# Patient Record
Sex: Male | Born: 1985 | Race: Black or African American | Hispanic: No | Marital: Married
Health system: Southern US, Community
[De-identification: ages and names within clinical notes are randomized; demographics above are authoritative.]

---

## 2021-02-20 ENCOUNTER — Encounter (HOSPITAL_BASED_OUTPATIENT_CLINIC_OR_DEPARTMENT_OTHER): Payer: Self-pay | Admitting: Family Medicine

## 2021-02-20 ENCOUNTER — Other Ambulatory Visit (HOSPITAL_BASED_OUTPATIENT_CLINIC_OR_DEPARTMENT_OTHER): Payer: Self-pay | Admitting: Family Medicine

## 2021-02-20 DIAGNOSIS — R52 Pain, unspecified: Secondary | ICD-10-CM

## 2021-02-28 ENCOUNTER — Ambulatory Visit (HOSPITAL_BASED_OUTPATIENT_CLINIC_OR_DEPARTMENT_OTHER)
Admission: RE | Admit: 2021-02-28 | Discharge: 2021-02-28 | Disposition: A | Discharge status: Home / Self Care | Payer: No Typology Code available for payment source | Source: Ambulatory Visit | Attending: Family Medicine | Admitting: Family Medicine

## 2021-02-28 ENCOUNTER — Other Ambulatory Visit: Payer: Self-pay

## 2021-02-28 DIAGNOSIS — R52 Pain, unspecified: Secondary | ICD-10-CM | POA: Insufficient documentation

## 2021-02-28 IMAGING — MR MR KNEE*R* W/O CM
5 of 7 series · 25 of 40 positions shown · non-contrast
Comparison: None.

CLINICAL DATA: Right knee pain

EXAM:
MRI OF THE RIGHT KNEE WITHOUT CONTRAST
TECHNIQUE: Multiplanar, multisequence MR imaging of the knee was performed. No
intravenous contrast was administered.

[Series 3: T2 fat-sat · axial · 3.0mm · 0.31mm/px · z∈[-83,+50]mm · 9 of 38 slices shown (1 of 2)]
[im 1/38]
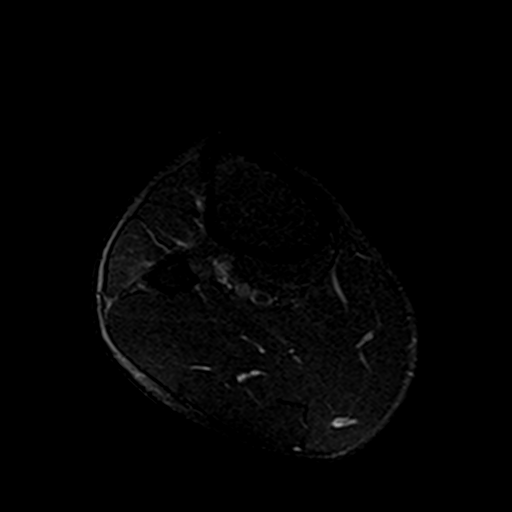
[im 5/38]
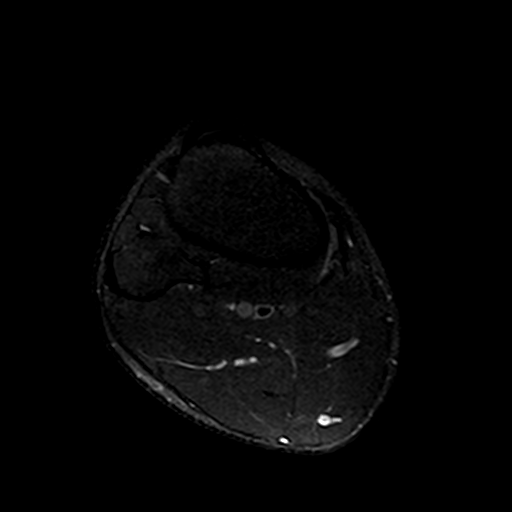
[im 10/38]
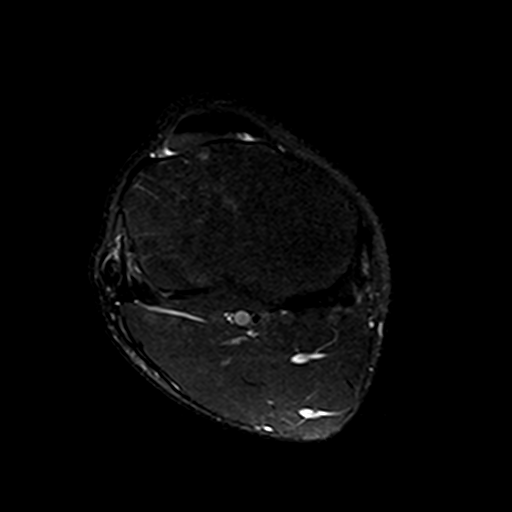
[im 14/38]
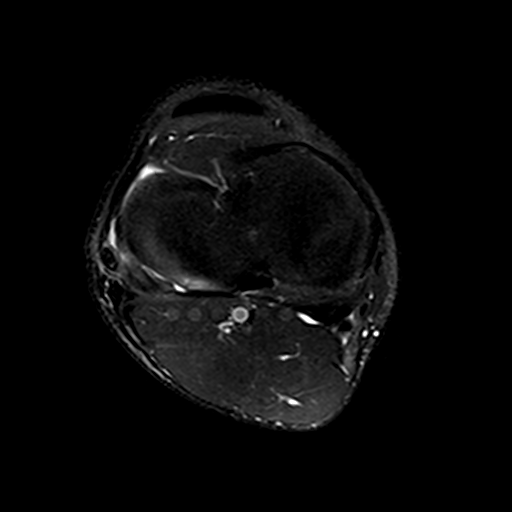
[im 19/38]
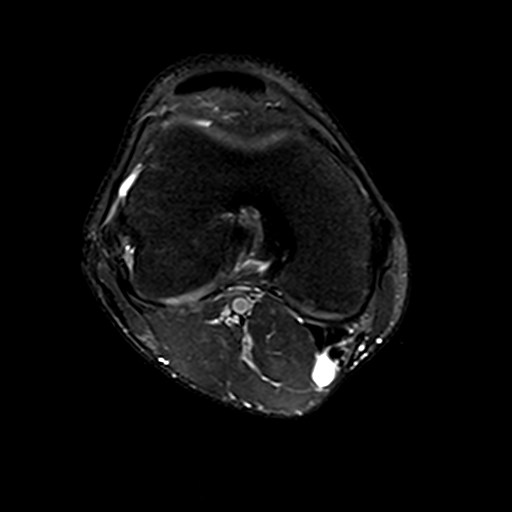
[im 24/38]
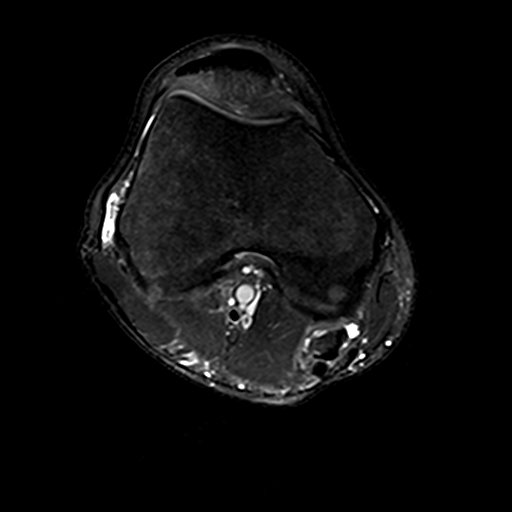
[im 28/38]
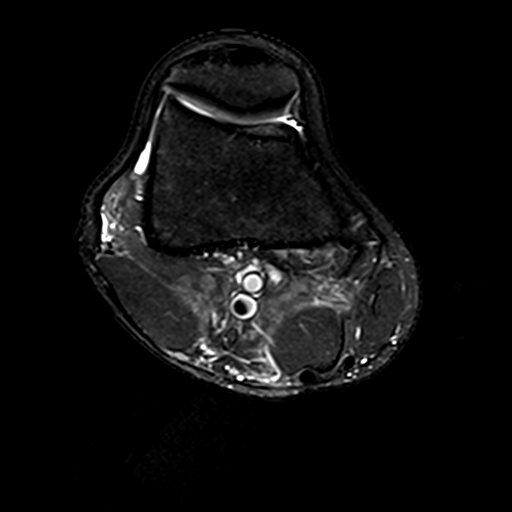
[im 33/38]
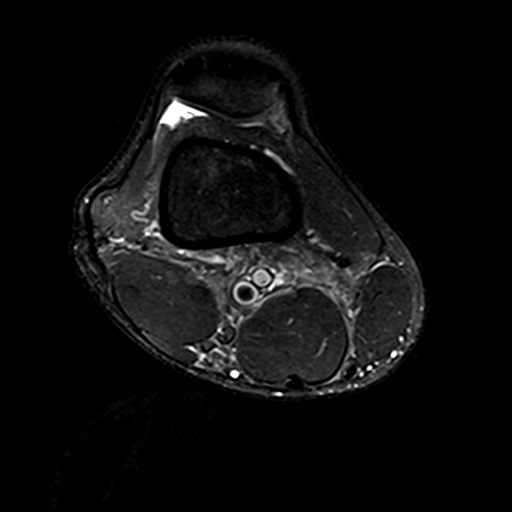
[im 38/38]
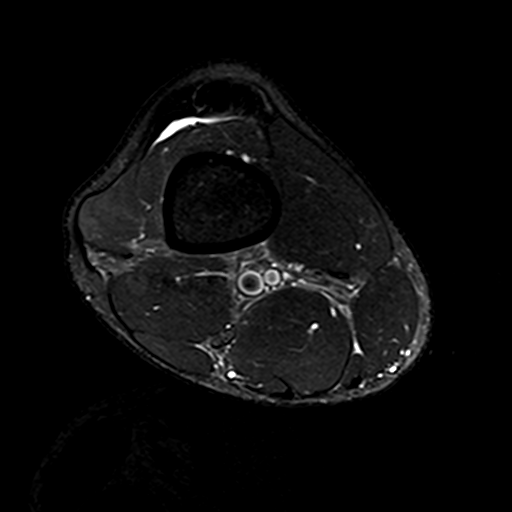

[Series 5: T2 fat-sat · coronal · 4.0mm · 0.29mm/px · 1 of 26 slices shown (2 of 2)]
[im 1/26]
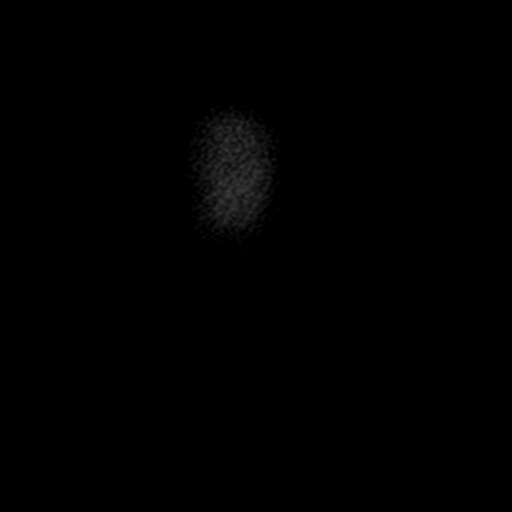

[Series 6: PD fat-sat · coronal · 4.0mm · 0.29mm/px · 5 of 26 slices shown (1 of 2)]
[im 1/26]
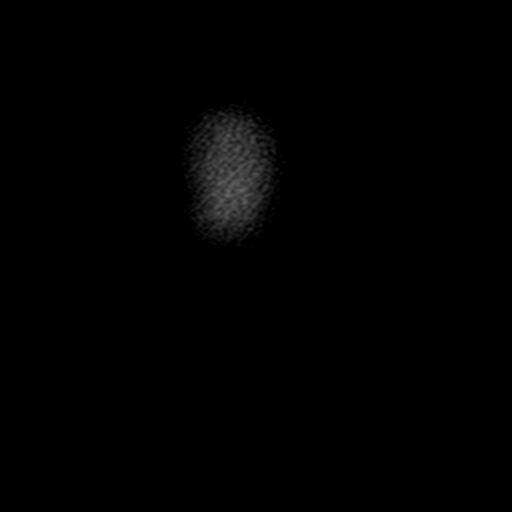
[im 7/26]
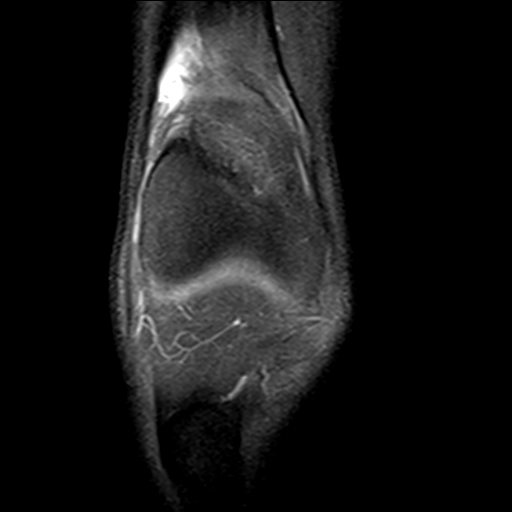
[im 13/26]
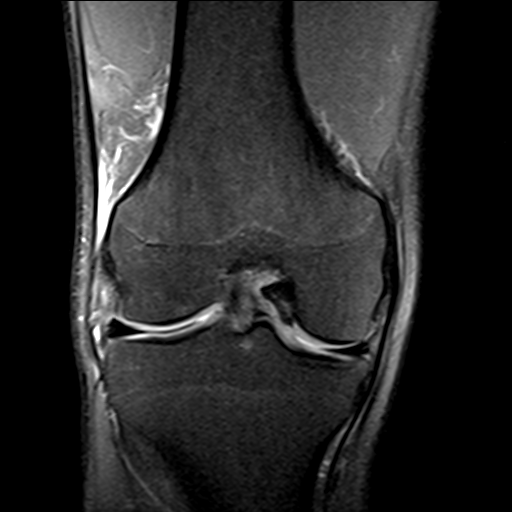
[im 19/26]
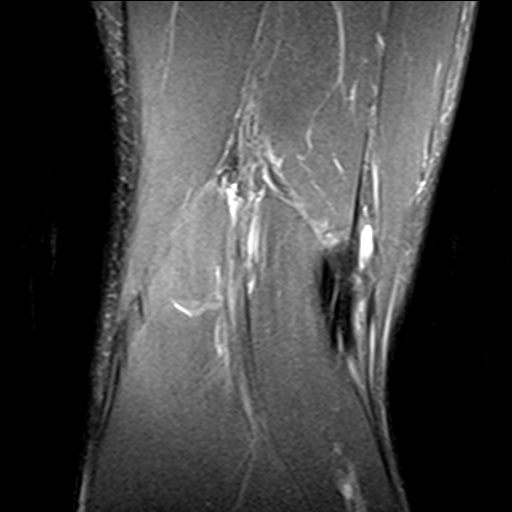
[im 26/26]
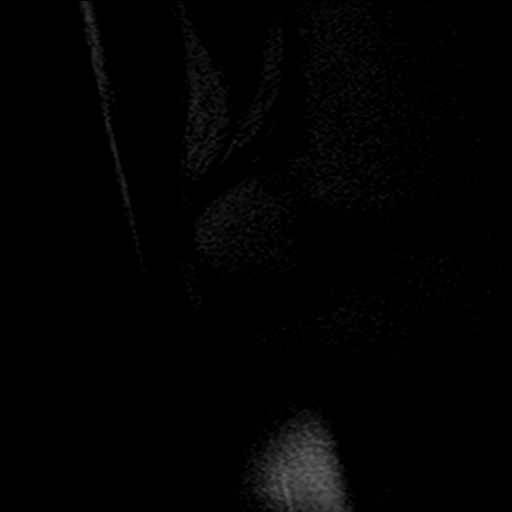

[Series 7: PD fat-sat · sagittal · 3.0mm · 0.59mm/px · 6 of 30 slices shown (2 of 2)]
[im 1/30]
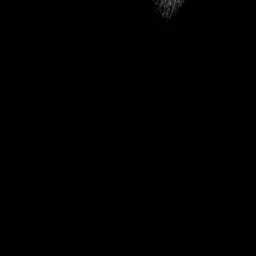
[im 6/30]
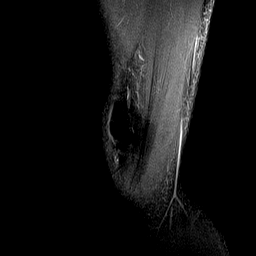
[im 12/30]
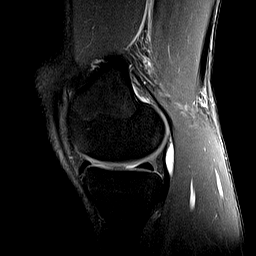
[im 18/30]
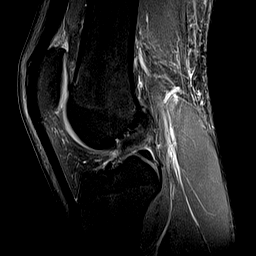
[im 24/30]
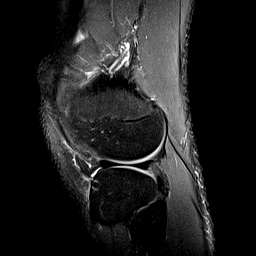
[im 30/30]
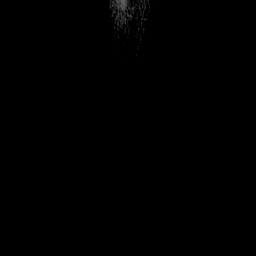

[Series 9: PD · oblique · 2.0mm · 0.47mm/px · 4 of 18 slices shown]
[im 1/18]
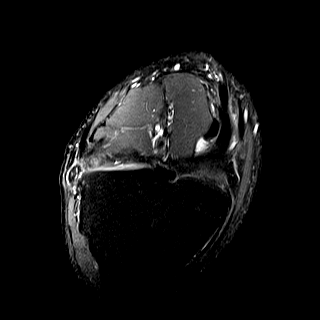
[im 6/18]
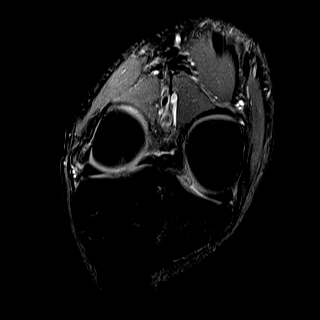
[im 12/18]
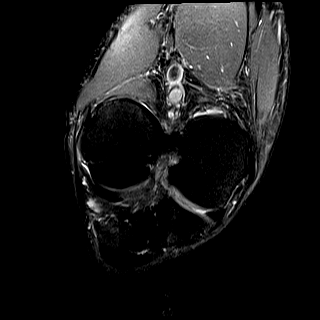
[im 18/18]
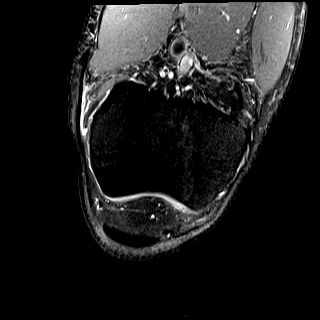

[25 of 40 positions shown; findings below may reference images not displayed]

FINDINGS: MENISCI

Medial: Intact.

Lateral: Intact.

LIGAMENTS

Cruciates: ACL and PCL are intact.

Collaterals: Medial collateral ligament is intact. Thickening and
increased signal within the proximal lateral collateral ligament.
There is intermediate signal within the proximal popliteus tendon.

CARTILAGE

Patellofemoral:  No chondral defect.

Medial:  No chondral defect.

Lateral: Partial-thickness cartilage defect along the weight-bearing
lateral femoral condyle measuring 5 mm (coronal T2 image 15).

JOINT: No significant joint effusion. There is edema signal within
the suprapatellar fat pad.

POPLITEAL FOSSA: Small Baker cyst.

EXTENSOR MECHANISM: Intact quadriceps tendon. Intact patellar
tendon.

BONES: No aggressive osseous lesion. No fracture or dislocation.

Other: No additional findings.
IMPRESSION: Thickening and mildly increased signal within the proximal lateral
collateral ligament compatible grade [DATE] ligamentous sprain.

Tendinosis of the proximal popliteus tendon.

Edema signal within the suprapatellar fat pad which can be seen in
suprapatellar impingement.

5 mm partial-thickness cartilage defect along the weight-bearing
lateral femoral condyle.

No evidence of meniscus tear.  Intact cruciate ligaments.

Small Baker cyst.

## 2021-02-28 IMAGING — MR MR CERVICAL SPINE W/O CM
4 of 5 series · 29 of 48 positions shown · non-contrast
Comparison: None available.

CLINICAL DATA: Initial evaluation for chronic neck pain status post
injury in [employer]. Bilateral proximal arm pain with numbness and
elbow and forearms.

EXAM:
MRI CERVICAL SPINE WITHOUT CONTRAST
TECHNIQUE: Multiplanar, multisequence MR imaging of the cervical spine was
performed. No intravenous contrast was administered.

[Series 2: (id) tse sag · sagittal · 3.0mm · 0.41mm/px · 5 of 17 slices shown]
[im 1/17]
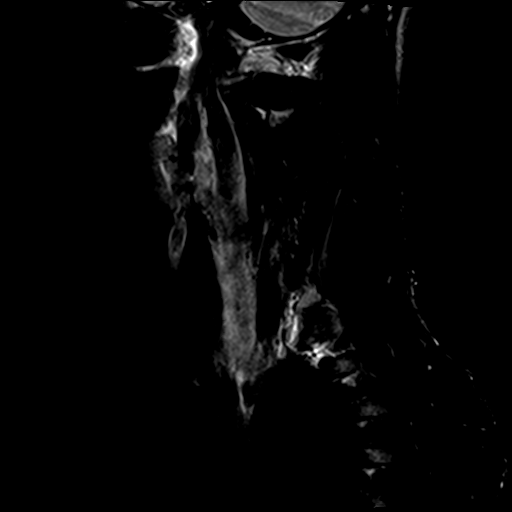
[im 4/17]
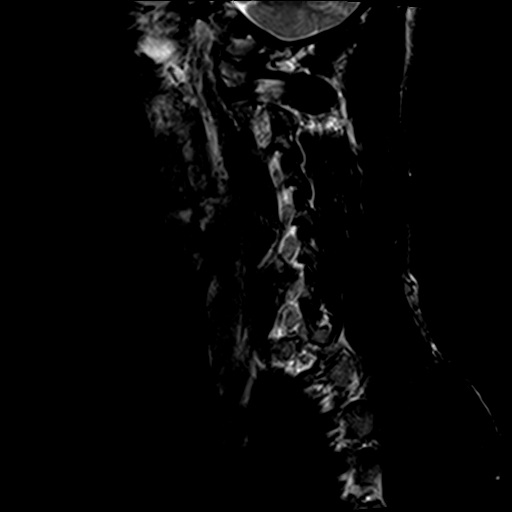
[im 7/17]
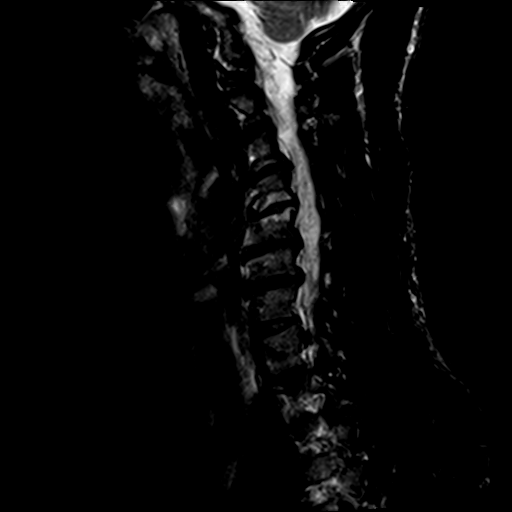
[im 10/17]
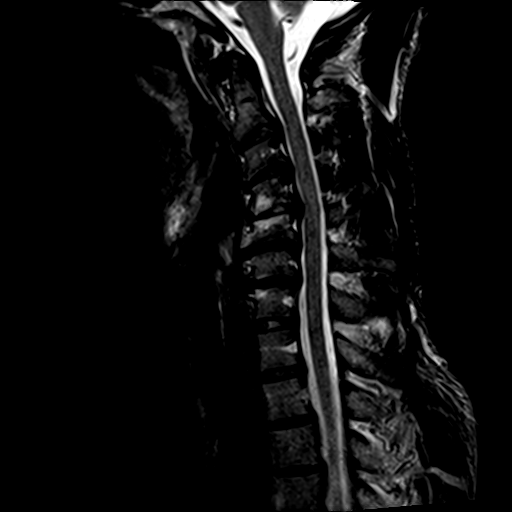
[im 17/17]
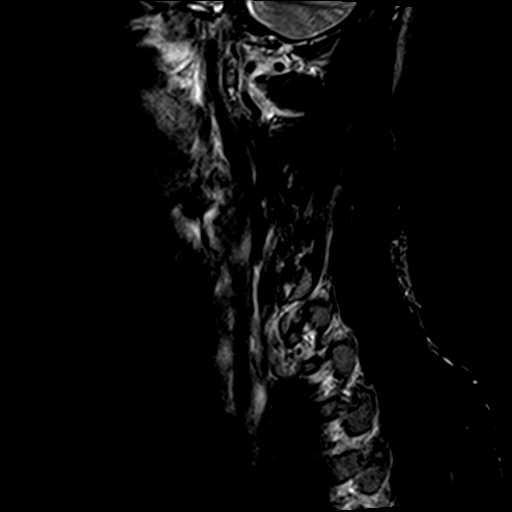

[Series 4: STIR · sagittal · 3.0mm · 0.82mm/px · 6 of 17 slices shown]
[im 1/17]
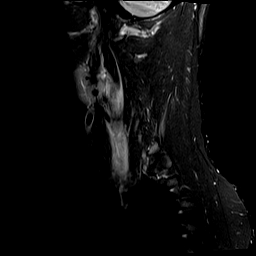
[im 4/17]
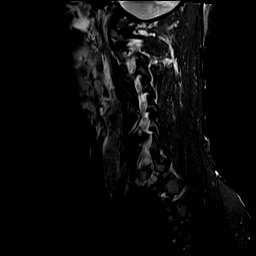
[im 7/17]
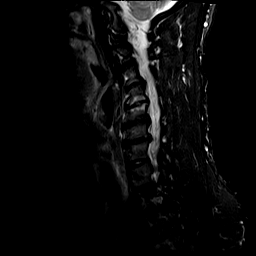
[im 10/17]
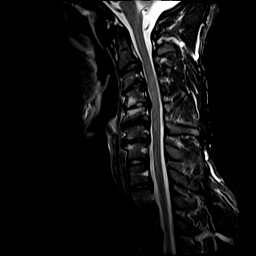
[im 13/17]
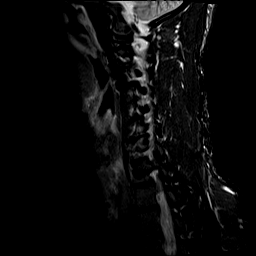
[im 17/17]
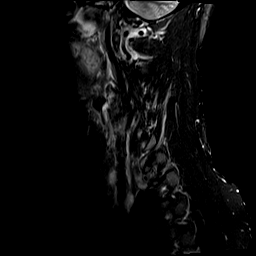

[Series 5: T2 · axial · 3.0mm · 0.39mm/px · z∈[-109,+29]mm · 9 of 40 slices shown (1 of 2)]
[im 1/40]
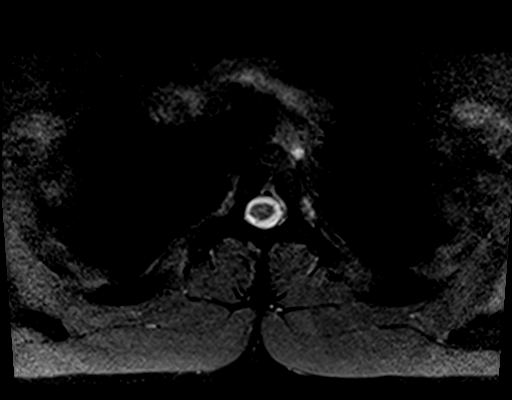
[im 6/40]
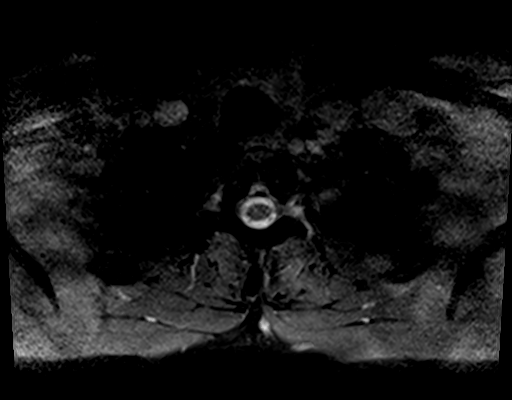
[im 12/40]
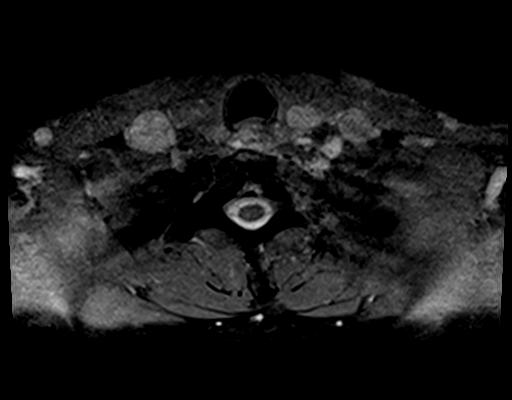
[im 17/40]
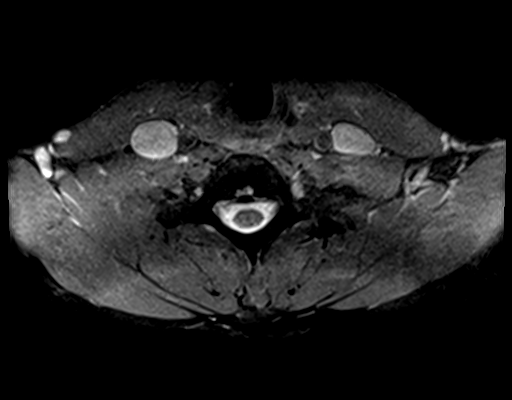
[im 20/40]
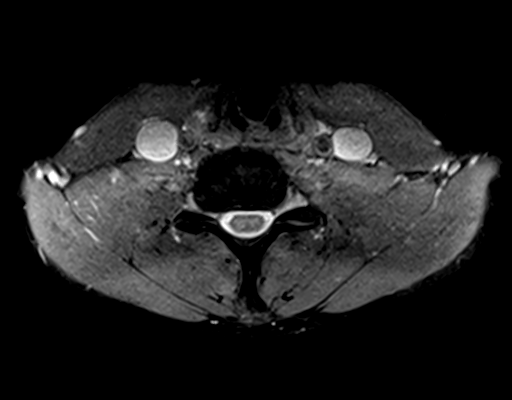
[im 23/40]
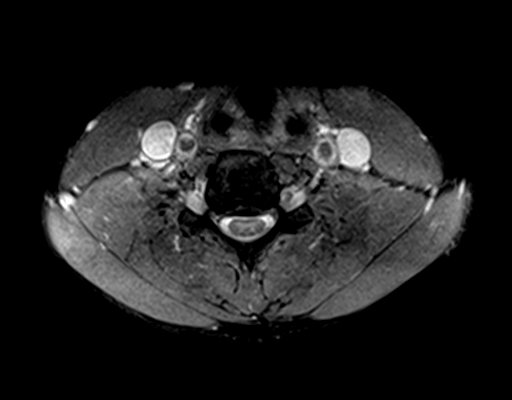
[im 28/40]
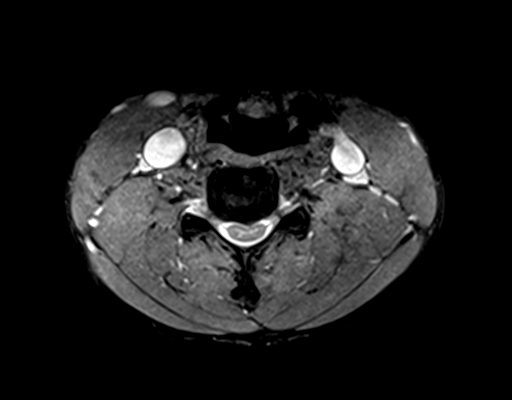
[im 34/40]
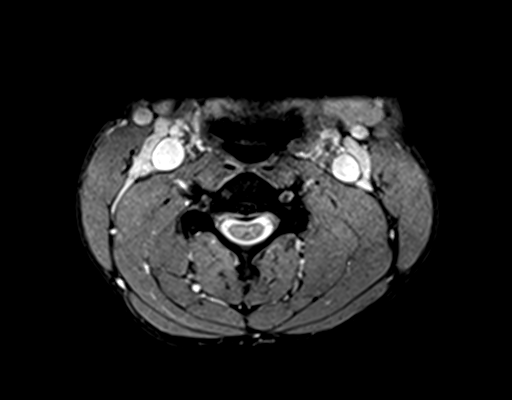
[im 40/40]
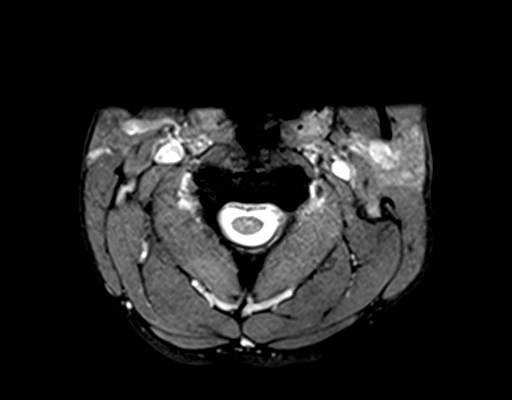

[Series 6: T2 · axial · 3.0mm · 0.78mm/px · z∈[-113,+25]mm · 9 of 40 slices shown (2 of 2)]
[im 1/40]
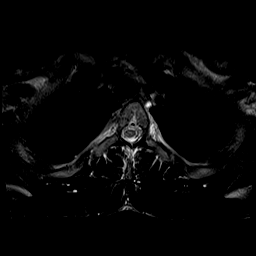
[im 6/40]
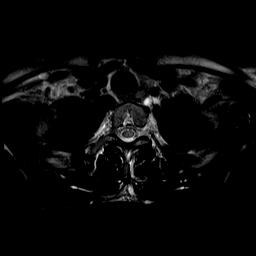
[im 12/40]
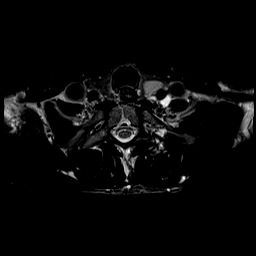
[im 17/40]
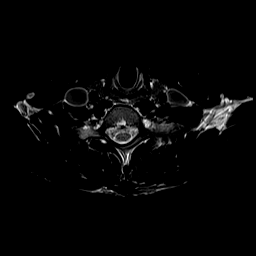
[im 20/40]
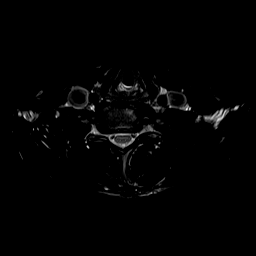
[im 23/40]
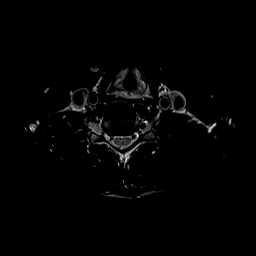
[im 28/40]
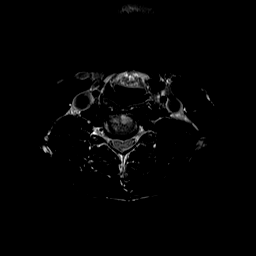
[im 34/40]
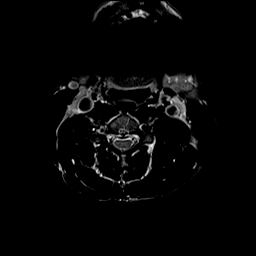
[im 40/40]
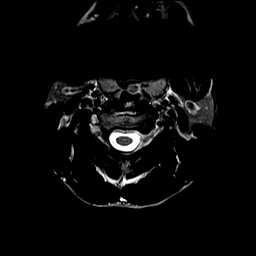

[29 of 48 positions shown; findings below may reference images not displayed]

FINDINGS: Alignment: Straightening with reversal of the normal cervical
lordosis. Underlying mild dextroscoliosis. No significant listhesis.

Vertebrae: Vertebral body height maintained without acute or chronic
fracture. Bone marrow signal intensity within normal limits. No
discrete or worrisome osseous lesions. Multilevel discogenic
reactive endplate changes noted at C3-4 through C6-7, most
pronounced at the C3-4 level. No other abnormal marrow edema.

Cord: Normal signal and morphology.

Posterior Fossa, vertebral arteries, paraspinal tissues:
Craniocervical junction within normal limits. Paraspinous and
prevertebral soft tissues are normal. Normal flow voids seen within
the vertebral arteries bilaterally. 1.5 cm left thyroid nodule
(series 6, image 29).

Disc levels:

C2-C3: Central disc osteophyte complex indents the ventral thecal
sac, slightly eccentric to the left. Mild flattening of the ventral
cord with mild spinal stenosis. Bilateral uncovertebral spurring
without significant foraminal stenosis.

C3-C4: Degenerative intervertebral disc space narrowing with diffuse
disc osteophyte complex, eccentric to the right. Broad posterior
component flattens and partially faces the ventral thecal sac,
asymmetric to the right. Mild cord flattening without cord signal
changes. Mild spinal stenosis. Moderate right C4 foraminal stenosis.
Left neural foramina remains patent.

C4-C5: Degenerative intervertebral disc space narrowing. Left
paracentral disc osteophyte complex indents the left ventral thecal
sac, contacting and flattening the left hemi cord. No cord signal
changes. Mild spinal stenosis. Superimposed bilateral uncinate
spurring with resultant mild left with mild-to-moderate right C5
foraminal stenosis.

C5-C6: Degenerative intervertebral disc space narrowing with diffuse
disc osteophyte complex. Flattening and partial effacement of the
ventral thecal sac without significant stenosis or cord impingement.
Mild left with mild-to-moderate right C6 foraminal narrowing.

C6-C7: Degenerative intervertebral disc space narrowing with diffuse
disc osteophyte complex. Associated right worse than left uncinate
spurring. Flattening of the ventral thecal sac without significant
spinal stenosis. Moderate right worse than left C7 foraminal
narrowing.

C7-T1: Negative interspace. Minimal facet hypertrophy. No canal or
foraminal stenosis.

Visualized upper thoracic spine demonstrates mild noncompressive
disc bulging without significant stenosis.
IMPRESSION: 1. Advanced for age multilevel cervical spondylosis at C2-3 through
C6-7, with resultant mild spinal stenosis at the C3-4 and C4-5
levels.
2. Multifactorial degenerative changes with resultant multilevel
foraminal narrowing as above. Notable findings include moderate
right C4 foraminal stenosis, mild to moderate right C5 and C6
foraminal narrowing, with moderate right worse than left C7
foraminal stenosis.
3. 1.5 cm left thyroid nodule. Further evaluation with dedicated
thyroid ultrasound recommended for further evaluation. (Ref: [HOSPITAL]. [DATE]): 143-50).

## 2021-03-31 NOTE — Addendum Note (Signed)
Encounter addended by: Novella Olive on: 03/31/2021 9:28 AM  Actions taken: Letter saved
